# Patient Record
Sex: Male | Born: 1960 | Race: White | Hispanic: No | Marital: Married | Smoking: Never smoker
Health system: Southern US, Community
[De-identification: ages and names within clinical notes are randomized; demographics above are authoritative.]

## PROBLEM LIST (undated history)

## (undated) DIAGNOSIS — G4733 Obstructive sleep apnea (adult) (pediatric): Secondary | ICD-10-CM

## (undated) DIAGNOSIS — R338 Other retention of urine: Secondary | ICD-10-CM

## (undated) DIAGNOSIS — M1990 Unspecified osteoarthritis, unspecified site: Secondary | ICD-10-CM

## (undated) DIAGNOSIS — I10 Essential (primary) hypertension: Secondary | ICD-10-CM

## (undated) DIAGNOSIS — N529 Male erectile dysfunction, unspecified: Secondary | ICD-10-CM

## (undated) DIAGNOSIS — G2581 Restless legs syndrome: Secondary | ICD-10-CM

## (undated) DIAGNOSIS — N9989 Other postprocedural complications and disorders of genitourinary system: Secondary | ICD-10-CM

## (undated) HISTORY — DX: Other retention of urine: R33.8

## (undated) HISTORY — DX: Restless legs syndrome: G25.81

## (undated) HISTORY — DX: Other postprocedural complications and disorders of genitourinary system: N99.89

## (undated) HISTORY — DX: Essential (primary) hypertension: I10

## (undated) HISTORY — DX: Male erectile dysfunction, unspecified: N52.9

## (undated) HISTORY — PX: BREAST LUMPECTOMY: SHX2

## (undated) HISTORY — DX: Unspecified osteoarthritis, unspecified site: M19.90

## (undated) HISTORY — PX: VASECTOMY: SHX75

## (undated) HISTORY — DX: Obstructive sleep apnea (adult) (pediatric): G47.33

---

## 2013-10-02 HISTORY — PX: COLONOSCOPY: SHX174

## 2017-05-31 HISTORY — PX: COLONOSCOPY: SHX174

## 2018-03-07 ENCOUNTER — Other Ambulatory Visit: Payer: Self-pay | Admitting: Psychiatry

## 2018-03-07 ENCOUNTER — Ambulatory Visit
Admission: RE | Admit: 2018-03-07 | Discharge: 2018-03-07 | Disposition: A | Discharge status: Home / Self Care | Payer: BLUE CROSS/BLUE SHIELD | Source: Ambulatory Visit | Attending: Ophthalmology | Admitting: Ophthalmology

## 2018-03-07 ENCOUNTER — Other Ambulatory Visit: Payer: Self-pay | Admitting: Ophthalmology

## 2018-03-07 ENCOUNTER — Other Ambulatory Visit: Payer: Self-pay | Admitting: Family Medicine

## 2018-03-07 DIAGNOSIS — M79662 Pain in left lower leg: Secondary | ICD-10-CM | POA: Diagnosis present

## 2019-11-08 HISTORY — PX: TOTAL KNEE ARTHROPLASTY: SHX125

## 2020-02-27 HISTORY — PX: TOTAL KNEE ARTHROPLASTY: SHX125

## 2020-09-22 IMAGING — US US EXTREM LOW VENOUS*L*
1 series · 13 of 24 positions shown · non-contrast
Comparison: None.

CLINICAL DATA: Left lower leg pain for 1 week.  Recent travel.



[Series 1: us extrem low venous*left* · 13 of 34 slices shown]
[im 1/34]
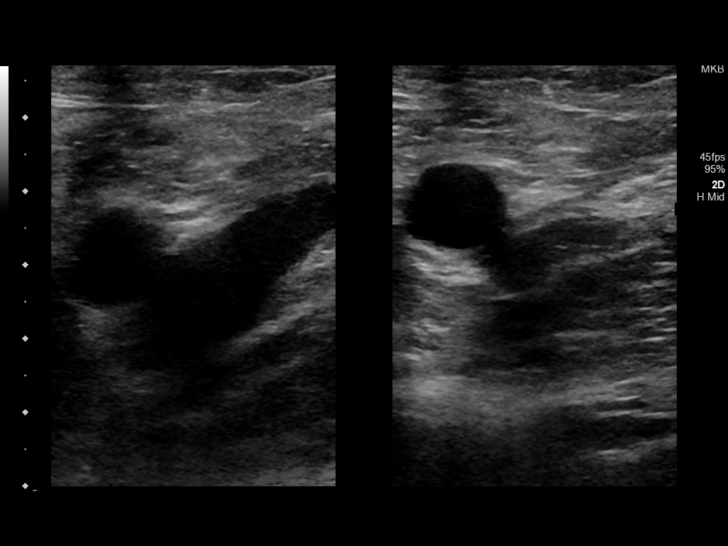
[im 3/34]
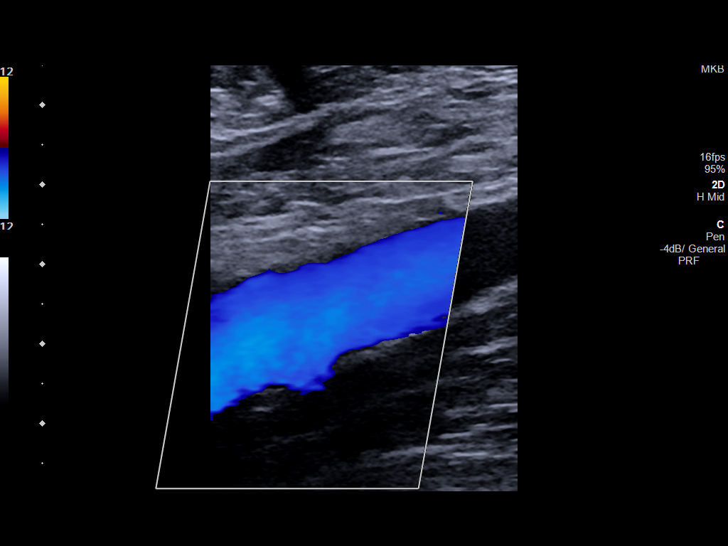
[im 6/34]
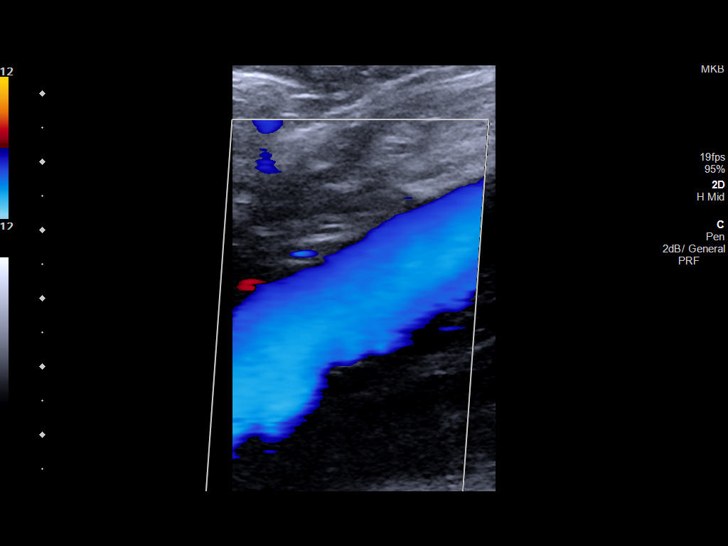
[im 9/34]
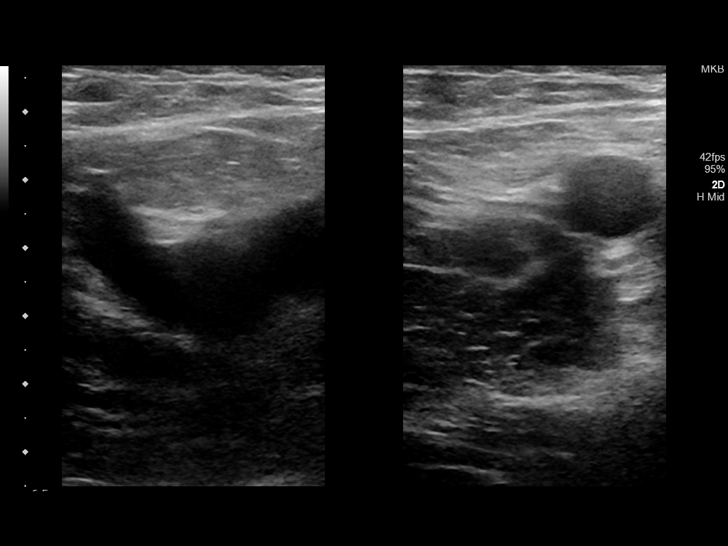
[im 12/34]
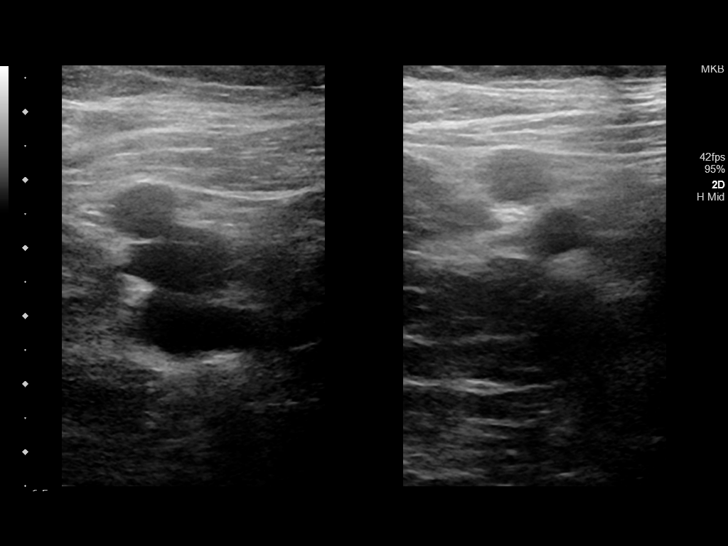
[im 15/34]
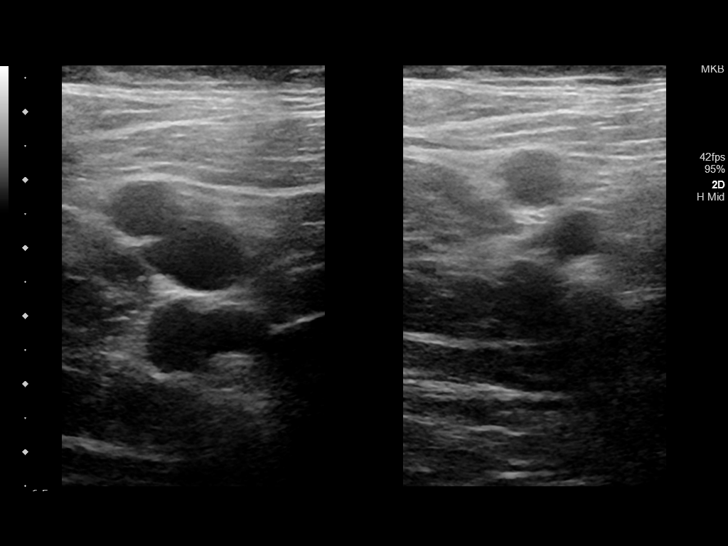
[im 18/34]
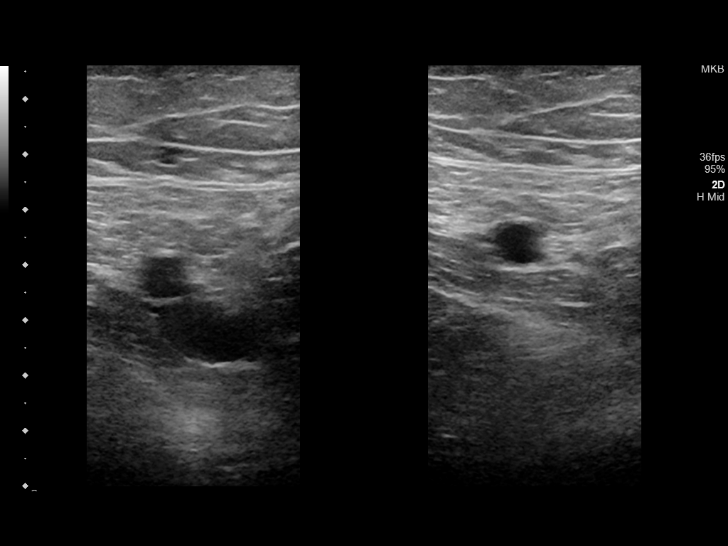
[im 19/34]
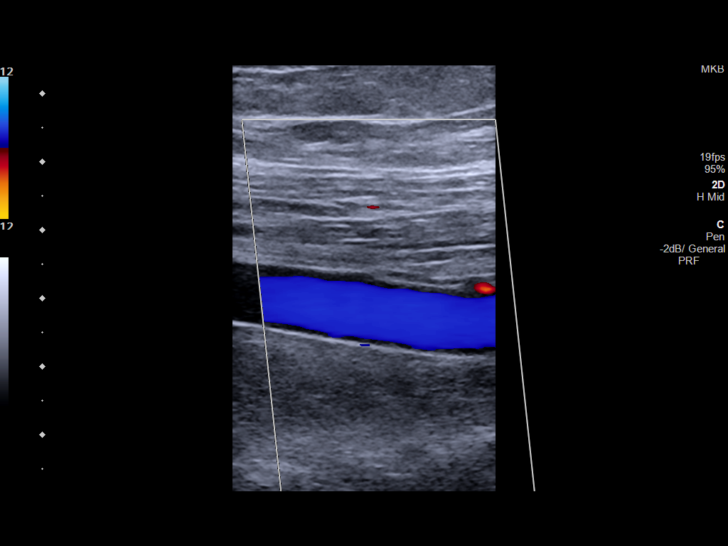
[im 22/34]
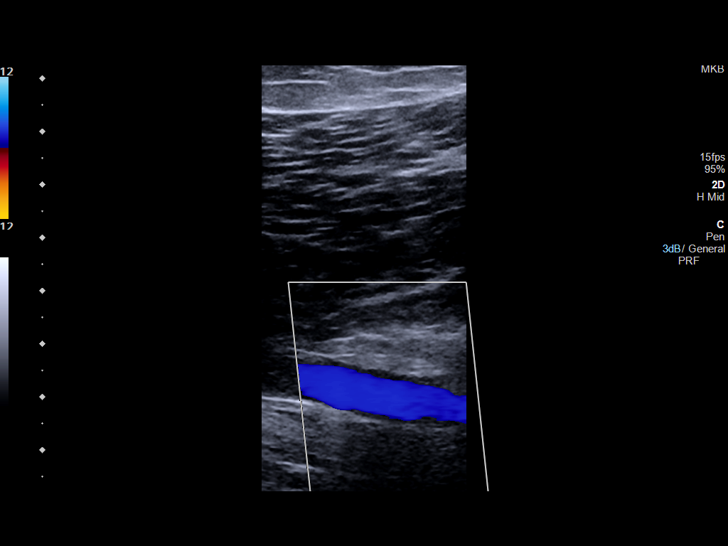
[im 25/34]
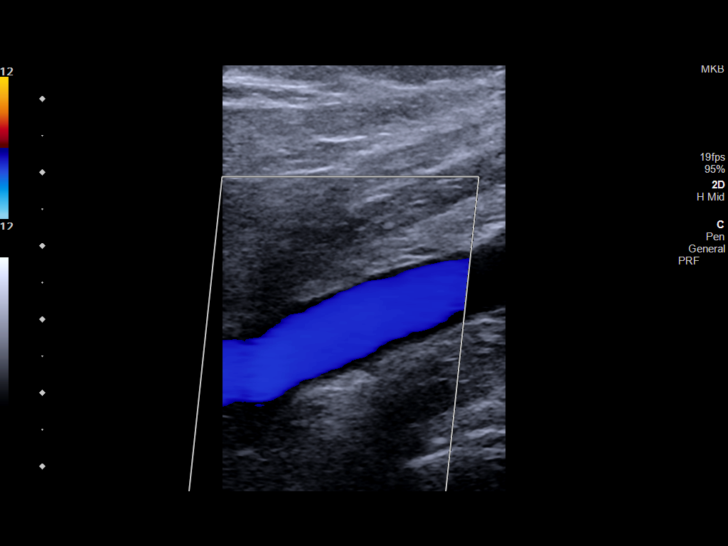
[im 28/34]
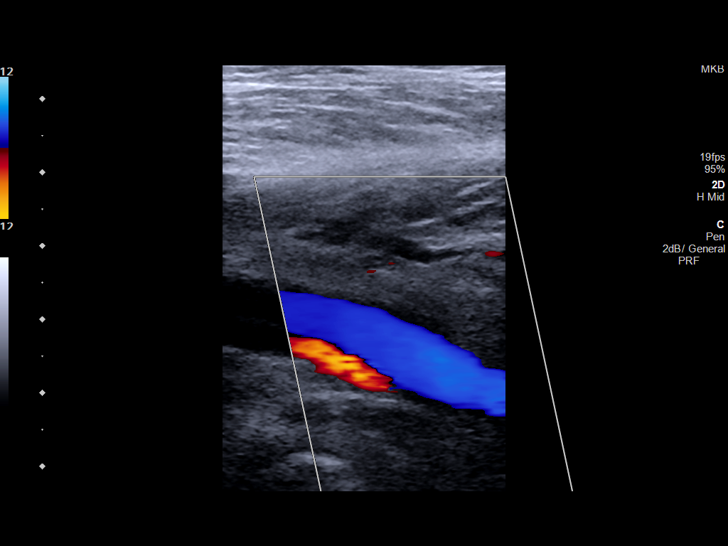
[im 31/34]
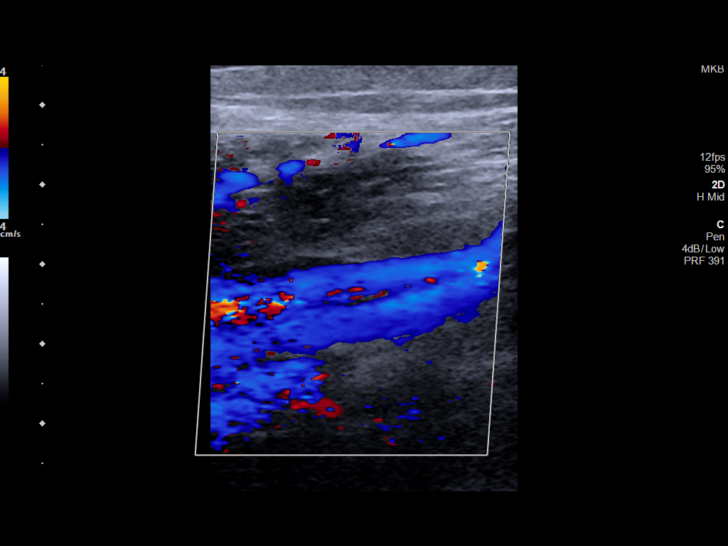
[im 34/34]
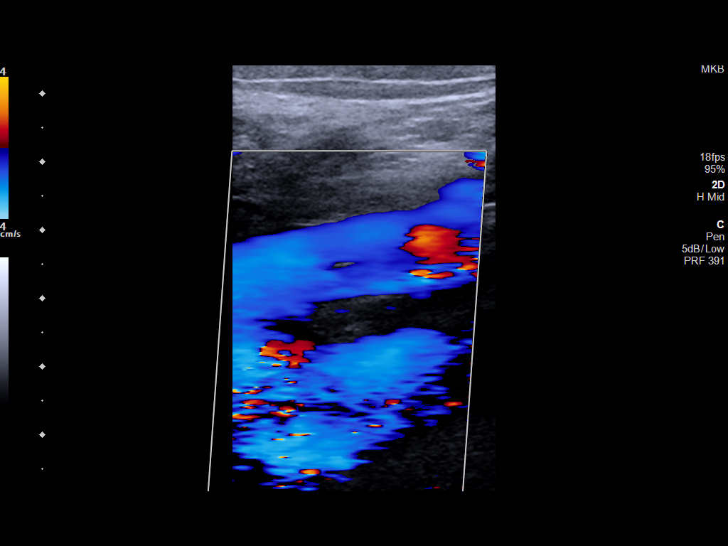

[13 of 24 positions shown; findings below may reference images not displayed]

FINDINGS: Contralateral Common Femoral Vein: Respiratory phasicity is normal
and symmetric with the symptomatic side. No evidence of thrombus.
Normal compressibility.

Common Femoral Vein: No evidence of thrombus. Normal
compressibility, respiratory phasicity and response to augmentation.

Saphenofemoral Junction: No evidence of thrombus. Normal
compressibility and flow on color Doppler imaging.

Profunda Femoral Vein: No evidence of thrombus. Normal
compressibility and flow on color Doppler imaging.

Femoral Vein: No evidence of thrombus. Normal compressibility,
respiratory phasicity and response to augmentation.

Popliteal Vein: No evidence of thrombus. Normal compressibility,
respiratory phasicity and response to augmentation.

Calf Veins: No evidence of thrombus. Normal compressibility and flow
on color Doppler imaging.

Other Findings:  None.
IMPRESSION: Negative for deep venous thrombosis in left lower extremity.

## 2021-12-24 ENCOUNTER — Encounter: Payer: Self-pay | Admitting: Urology

## 2021-12-24 ENCOUNTER — Ambulatory Visit (INDEPENDENT_AMBULATORY_CARE_PROVIDER_SITE_OTHER): Payer: BC Managed Care – PPO | Admitting: Urology

## 2021-12-24 ENCOUNTER — Other Ambulatory Visit
Admission: RE | Admit: 2021-12-24 | Discharge: 2021-12-24 | Disposition: A | Discharge status: Home / Self Care | Payer: BC Managed Care – PPO | Attending: Urology | Admitting: Urology

## 2021-12-24 VITALS — BP 132/88 | HR 67 | Ht 74.0 in | Wt 325.0 lb

## 2021-12-24 DIAGNOSIS — Z125 Encounter for screening for malignant neoplasm of prostate: Secondary | ICD-10-CM | POA: Diagnosis not present

## 2021-12-24 DIAGNOSIS — N529 Male erectile dysfunction, unspecified: Secondary | ICD-10-CM | POA: Diagnosis not present

## 2021-12-24 MED ORDER — TADALAFIL 10 MG PO TABS: 20.0000 mg | ORAL_TABLET | Freq: Every day | ORAL | 11 refills | Status: AC | PRN

## 2021-12-24 NOTE — Progress Notes (Signed)
   12/24/21 11:35 AM   Eric Davies 1960-09-20 [id number]  CC: ED, recent prostatitis, PSA screening.  HPI: I saw Eric Davies for the above issues.  He is a 61 year old male with morbid obesity and BMI of 42 who reports at least a few years of problems with erections.  Previously though this was responsive to tadalafil 10 to 20 mg on demand, but now even when taking 20 to 30 mg on demand he has difficulty maintaining an erection sufficient for sexual activity.  No prior testosterone values to review.  He also reports a recent episode of prostatitis that was treated with Cipro for 1 month, and completely resolved his symptoms of urgency/frequency, nocturia, and pelvic discomfort.  PSA screening has been normal, most recently 2.15 in December 2022   PMH: Past Medical History:  Diagnosis Date   ED (erectile dysfunction)    Hypertension    Obstructive sleep apnea    Osteoarthritis    Postoperative urinary retention    RLS (restless legs syndrome)     Family History: No family history on file.  Social History:  reports current alcohol use. He reports that he does not use drugs. No history on file for tobacco use.  Physical Exam: BP 132/88 (BP Location: Left Arm, Patient Position: Sitting, Cuff Size: Large)   Pulse 67   Ht 6\' 2"  (1.88 m)   Wt (!) 325 lb (147.4 kg)   BMI 41.73 kg/m    Constitutional:  Alert and oriented, No acute distress. Cardiovascular: No clubbing, cyanosis, or edema. Respiratory: Normal respiratory effort, no increased work of breathing. GI: Abdomen is soft, nontender, nondistended, no abdominal masses  Assessment & Plan:   61 year old male with ED despite 20 mg Cialis on demand, recent episode of prostatitis resolved with antibiotics, normal PSA.  We reviewed the AUA guidelines regarding ED evaluation and management, and I recommended checking a testosterone and estradiol in the setting of his morbid obesity.  Behavioral strategies discussed.  We discussed  other options including penile injections, vacuum erection device, penile rings as well.  We reviewed his normal PSA value and the AUA guidelines regarding screening.  Testosterone and estradiol level, call with results Continue Cialis 20 mg on demand, refilled to [email or web address]  41, MD 12/24/2021  [facility] [address] [place], [place] [phone]

## 2021-12-24 NOTE — Patient Instructions (Addendum)
[email or web address]  Erectile Dysfunction Erectile dysfunction (ED) is the inability to get or keep an erection in order to have sexual intercourse. ED is considered a symptom of an underlying disorder and is not considered a disease. ED may include: Inability to get an erection. Lack of enough hardness of the erection to allow penetration. Loss of erection before sex is finished. What are the causes? This condition may be caused by: Physical causes, such as: Artery problems. This may include heart disease, high blood pressure, atherosclerosis, and diabetes. Hormonal problems, such as low testosterone. Obesity. Nerve problems. This may include back or pelvic injuries, multiple sclerosis, Parkinson's disease, spinal cord injury, and stroke. Certain medicines, such as: Pain relievers. Antidepressants. Blood pressure medicines and water pills (diuretics). Cancer medicines. Antihistamines. Muscle relaxants. Lifestyle factors, such as: Use of drugs such as marijuana, cocaine, or opioids. Excessive use of alcohol. Smoking. Lack of physical activity or exercise. Psychological causes, such as: Anxiety or stress. Sadness or depression. Exhaustion. Fear about sexual performance. Guilt. What are the signs or symptoms? Symptoms of this condition include: Inability to get an erection. Lack of enough hardness of the erection to allow penetration. Loss of the erection before sex is finished. Sometimes having normal erections, but with frequent unsatisfactory episodes. Low sexual satisfaction in either partner due to erection problems. A curved penis occurring with erection. The curve may cause pain, or the penis may be too curved to allow for intercourse. Never having nighttime or morning erections. How is this diagnosed? This condition is often diagnosed by: Performing a physical exam to find other diseases or specific problems with the penis. Asking you  detailed questions about the problem. Doing tests, such as: Blood tests to check for diabetes mellitus or high cholesterol, or to measure hormone levels. Other tests to check for underlying health conditions. An ultrasound exam to check for scarring. A test to check blood flow to the penis. Doing a sleep study at home to measure nighttime erections. How is this treated? This condition may be treated by: Medicines, such as: Medicine taken by mouth to help you achieve an erection (oral medicine). Hormone replacement therapy to replace low testosterone levels. Medicine that is injected into the penis. Your health care provider may instruct you how to give yourself these injections at home. Medicine that is delivered with a short applicator tube. The tube is inserted into the opening at the tip of the penis, which is the opening of the urethra. A tiny pellet of medicine is put in the urethra. The pellet dissolves and enhances erectile function. This is also called MUSE (medicated urethral system for erections) therapy. Vacuum pump. This is a pump with a ring on it. The pump and ring are placed on the penis and used to create pressure that helps the penis become erect. Penile implant surgery. In this procedure, you may receive: An inflatable implant. This consists of cylinders, a pump, and a reservoir. The cylinders can be inflated with a fluid that helps to create an erection, and they can be deflated after intercourse. A semi-rigid implant. This consists of two silicone rubber rods. The rods provide some rigidity. They are also flexible, so the penis can both curve downward in its normal position and become straight for sexual intercourse. Blood vessel surgery to improve blood flow to the penis. During this procedure, a blood vessel from a different part of the body is placed into the penis to allow blood to flow around (bypass) damaged or blocked blood vessels.  Lifestyle changes, such as exercising  more, losing weight, and quitting smoking. Follow these instructions at home: Medicines  Take over-the-counter and prescription medicines only as told by your health care provider. Do not increase the dosage without first discussing it with your health care provider. If you are using self-injections, do injections as directed by your health care provider. Make sure you avoid any veins that are on the surface of the penis. After giving an injection, apply pressure to the injection site for 5 minutes. Talk to your health care provider about how to prevent headaches while taking ED medicines. These medicines may cause a sudden headache due to the increase in blood flow in your body. General instructions Exercise regularly, as directed by your health care provider. Work with your health care provider to lose weight, if needed. Do not use any products that contain nicotine or tobacco. These products include cigarettes, chewing tobacco, and vaping devices, such as e-cigarettes. If you need help quitting, ask your health care provider. Before using a vacuum pump, read the instructions that come with the pump and discuss any questions with your health care provider. Keep all follow-up visits. This is important. Contact a health care provider if: You feel nauseous. You are vomiting. You get sudden headaches while taking ED medicines. You have any concerns about your sexual health. Get help right away if: You are taking oral or injectable medicines and you have an erection that lasts longer than 4 hours. If your health care provider is unavailable, go to the nearest emergency room for evaluation. An erection that lasts much longer than 4 hours can result in permanent damage to your penis. You have severe pain in your groin or abdomen. You develop redness or severe swelling of your penis. You have redness spreading at your groin or lower abdomen. You are unable to urinate. You experience chest pain or a  rapid heartbeat (palpitations) after taking oral medicines. These symptoms may represent a serious problem that is an emergency. Do not wait to see if the symptoms will go away. Get medical help right away. Call your local emergency services (911 in the [place] Do not drive yourself to the hospital. Summary Erectile dysfunction (ED) is the inability to get or keep an erection during sexual intercourse. This condition is diagnosed based on a physical exam, your symptoms, and tests to determine the cause. Treatment varies depending on the cause and may include medicines, hormone therapy, surgery, or a vacuum pump. You may need follow-up visits to make sure that you are using your medicines or devices correctly. Get help right away if you are taking or injecting medicines and you have an erection that lasts longer than 4 hours. This information is not intended to replace advice given to you by your health care provider. Make sure you discuss any questions you have with your health care provider. Document Revised: 08/28/2020 Document Reviewed: 08/28/2020 Elsevier Patient Education  2023 [employer].

## 2021-12-25 LAB — TESTOSTERONE: Testosterone: 492 ng/dL (ref 264–916)

## 2021-12-31 ENCOUNTER — Telehealth: Payer: Self-pay | Admitting: Family Medicine

## 2021-12-31 DIAGNOSIS — N529 Male erectile dysfunction, unspecified: Secondary | ICD-10-CM

## 2021-12-31 NOTE — Telephone Encounter (Signed)
Aram Beecham from [facility] lab called and states she drew the Estradiol and Testosterone but the Estradiol got cancelled because she messed it up. She states it was her fault. They have already dumped the blood so she couldn't get it corrected.

## 2022-01-01 NOTE — Telephone Encounter (Signed)
Called pt informed him of the information below. Pt voiced understanding. Lab ordered.  

## 2022-01-05 ENCOUNTER — Other Ambulatory Visit
Admission: RE | Admit: 2022-01-05 | Discharge: 2022-01-05 | Disposition: A | Discharge status: Home / Self Care | Payer: BC Managed Care – PPO | Attending: Urology | Admitting: Urology

## 2022-01-05 DIAGNOSIS — N529 Male erectile dysfunction, unspecified: Secondary | ICD-10-CM | POA: Insufficient documentation

## 2022-01-06 LAB — ESTRADIOL: Estradiol: 26.3 pg/mL (ref 7.6–42.6)

## 2022-05-20 ENCOUNTER — Ambulatory Visit
Admission: RE | Admit: 2022-05-20 | Discharge: 2022-05-20 | Disposition: A | Discharge status: Home / Self Care | Payer: BC Managed Care – PPO | Attending: Internal Medicine | Admitting: Internal Medicine

## 2022-05-20 ENCOUNTER — Encounter
Admission: RE | Disposition: A | Discharge status: Home / Self Care | Payer: Self-pay | Source: Home / Self Care | Attending: Internal Medicine

## 2022-05-20 ENCOUNTER — Ambulatory Visit: Payer: BC Managed Care – PPO | Admitting: Anesthesiology

## 2022-05-20 ENCOUNTER — Other Ambulatory Visit: Payer: Self-pay

## 2022-05-20 ENCOUNTER — Encounter: Payer: Self-pay | Admitting: Internal Medicine

## 2022-05-20 DIAGNOSIS — Z86010 Personal history of colonic polyps: Secondary | ICD-10-CM | POA: Insufficient documentation

## 2022-05-20 DIAGNOSIS — K5730 Diverticulosis of large intestine without perforation or abscess without bleeding: Secondary | ICD-10-CM | POA: Insufficient documentation

## 2022-05-20 DIAGNOSIS — K640 First degree hemorrhoids: Secondary | ICD-10-CM | POA: Diagnosis not present

## 2022-05-20 DIAGNOSIS — Z79899 Other long term (current) drug therapy: Secondary | ICD-10-CM | POA: Diagnosis not present

## 2022-05-20 DIAGNOSIS — M1990 Unspecified osteoarthritis, unspecified site: Secondary | ICD-10-CM | POA: Diagnosis not present

## 2022-05-20 DIAGNOSIS — I10 Essential (primary) hypertension: Secondary | ICD-10-CM | POA: Diagnosis not present

## 2022-05-20 DIAGNOSIS — Z1211 Encounter for screening for malignant neoplasm of colon: Secondary | ICD-10-CM | POA: Diagnosis present

## 2022-05-20 DIAGNOSIS — G2581 Restless legs syndrome: Secondary | ICD-10-CM | POA: Diagnosis not present

## 2022-05-20 DIAGNOSIS — G4733 Obstructive sleep apnea (adult) (pediatric): Secondary | ICD-10-CM | POA: Diagnosis not present

## 2022-05-20 HISTORY — PX: COLONOSCOPY: SHX5424

## 2022-05-20 SURGERY — COLONOSCOPY
Anesthesia: General

## 2022-05-20 MED ORDER — MIDAZOLAM HCL 2 MG/2ML IJ SOLN
INTRAMUSCULAR | Status: AC
  Filled 2022-05-20: qty 2

## 2022-05-20 MED ORDER — PROPOFOL 500 MG/50ML IV EMUL
INTRAVENOUS | Status: DC | PRN
  Administered 2022-05-20: 150 ug/kg/min via INTRAVENOUS

## 2022-05-20 MED ORDER — MIDAZOLAM HCL 2 MG/2ML IJ SOLN
INTRAMUSCULAR | Status: DC | PRN
  Administered 2022-05-20: 2 mg via INTRAVENOUS

## 2022-05-20 MED ORDER — DEXMEDETOMIDINE HCL IN NACL 200 MCG/50ML IV SOLN
INTRAVENOUS | Status: DC | PRN
  Administered 2022-05-20: 8 ug via INTRAVENOUS

## 2022-05-20 MED ORDER — LIDOCAINE HCL (CARDIAC) PF 100 MG/5ML IV SOSY
PREFILLED_SYRINGE | INTRAVENOUS | Status: DC | PRN
  Administered 2022-05-20: 50 mg via INTRAVENOUS

## 2022-05-20 MED ORDER — PROPOFOL 1000 MG/100ML IV EMUL
INTRAVENOUS | Status: AC
  Filled 2022-05-20: qty 100

## 2022-05-20 MED ORDER — PROPOFOL 10 MG/ML IV BOLUS
INTRAVENOUS | Status: DC | PRN
  Administered 2022-05-20: 50 mg via INTRAVENOUS

## 2022-05-20 MED ORDER — SODIUM CHLORIDE 0.9 % IV SOLN: INTRAVENOUS | Status: DC

## 2022-05-20 MED ORDER — PHENYLEPHRINE HCL (PRESSORS) 10 MG/ML IV SOLN
INTRAVENOUS | Status: DC | PRN
  Administered 2022-05-20: 160 ug via INTRAVENOUS

## 2022-05-20 NOTE — Transfer of Care (Signed)
Immediate Anesthesia Transfer of Care Note  Patient: Eric Davies  Procedure(s) Performed: COLONOSCOPY  Patient Location: PACU  Anesthesia Type:General  Level of Consciousness: sedated  Airway & Oxygen Therapy: Patient Spontanous Breathing and Patient connected to nasal cannula oxygen  Post-op Assessment: Report given to RN and Post -op Vital signs reviewed and stable  Post vital signs: Reviewed and stable  Last Vitals:  Vitals Value Taken Time  BP 93/62 05/20/22 1350  Temp 36.3 C 05/20/22 1350  Pulse 72 05/20/22 1350  Resp 15 05/20/22 1350  SpO2 92 % 05/20/22 1350  Vitals shown include unvalidated device data.  Last Pain:  Vitals:   05/20/22 1350  TempSrc: Temporal  PainSc:          Complications: No notable events documented.

## 2022-05-20 NOTE — H&P (Signed)
  Outpatient short stay form Pre-procedure 05/20/2022 1:27 PM Jamilah Jean K. Norma Fredrickson, M.D.  Primary Physician: Maudie Flakes, M.D.  Reason for visit:  Personal history of adenomatous colon polyps  History of present illness:  Mr. Sprigg presents to the [facility] clinic for chief complaint of high-risk colon cancer surveillance 2/2 personal hx of adenomatous polyp of colon. Patient had original screening colonoscopy performed 09/2013 which revealed one subcentimeter tubular adenoma removed at 18 cm. Repeat surveillance colonoscopy 05/2017 performed by Dr. Norma Fredrickson showed sigmoid diverticulosis and otherwise normal examined colon. He denies any known family history of colorectal adenocarcinoma, advanced adenomas, or IBD. He denies any acute GI complaints today. He denies any recent changes in his bowel habits. Bowels are regular and typically 1-2 formed bowel movements daily. He denies any issues with hematochezia, melena, fecal urgency, or fecal incontinence. No issues with abdominal pain or abdominal cramping. Appetite and diet are stable without any unintentional weight loss. He denies any issues with esophageal dysphagia, odynophagia, early satiety, hoarseness, or epigastric abdominal pain. He enjoys riding his motorcycle in his free time and reading. No significant cardiac or pulmonary comorbidities.     Current Facility-Administered Medications:    0.9 %  sodium chloride infusion, , Intravenous, Continuous, [place], Boykin Nearing, MD, Last Rate: 20 mL/hr at 05/20/22 1220, New Bag at 05/20/22 1220  Medications Prior to Admission  Medication Sig Dispense Refill Last Dose   aspirin EC 81 MG tablet Take 81 mg by mouth daily. Swallow whole.   05/19/2022   gabapentin (NEURONTIN) 100 MG capsule Take 100 mg by mouth at bedtime.   05/19/2022   Glucosamine HCl-MSM (GLUCOSAMINE-MSM PO) Take 2 tablets by mouth daily.   05/19/2022   lisinopril-hydrochlorothiazide (ZESTORETIC) 20-25 MG tablet Take 1 tablet by mouth  daily.   05/20/2022 at 0600   Misc Natural Products (NEURIVA PO) Take by mouth daily.   05/19/2022   Multiple Vitamins-Minerals (CENTRUM SILVER 50+MEN PO) Take 1 tablet by mouth daily.   05/19/2022   rOPINIRole (REQUIP) 0.25 MG tablet Take by mouth.   05/19/2022   tadalafil (CIALIS) 10 MG tablet Take 2 tablets (20 mg total) by mouth daily as needed for erectile dysfunction. 60 tablet 11 05/19/2022   UNABLE TO FIND 2 (two) times daily. Super Beta Prostate   05/19/2022     No Known Allergies   Past Medical History:  Diagnosis Date   ED (erectile dysfunction)    Hypertension    Obstructive sleep apnea    Osteoarthritis    Postoperative urinary retention    RLS (restless legs syndrome)     Review of systems:  Otherwise negative.    Physical Exam  Gen: Alert, oriented. Appears stated age.  HEENT: [place]/AT. PERRLA. Lungs: CTA, no wheezes. CV: RR nl S1, S2. Abd: soft, benign, no masses. BS+ Ext: No edema. Pulses 2+    Planned procedures: Proceed with colonoscopy. The patient understands the nature of the planned procedure, indications, risks, alternatives and potential complications including but not limited to bleeding, infection, perforation, damage to internal organs and possible oversedation/side effects from anesthesia. The patient agrees and gives consent to proceed.  Please refer to procedure notes for findings, recommendations and patient disposition/instructions.     Naleigha Raimondi K. Norma Fredrickson, M.D. Gastroenterology 05/20/2022  1:27 PM

## 2022-05-20 NOTE — Op Note (Signed)
[facility] Patient Name: Eric Davies Procedure Date: 05/20/2022 1:25 PM MRN: [id number] Account #: [id number] Date of Birth: 10-21-1960 Admit Type: Outpatient Age: 61 Room: [facility] ENDO ROOM 2 Gender: Male Note Status: Finalized Instrument Name: Nelda Marseille [id number] Procedure:             Colonoscopy Indications:           High risk colon cancer surveillance: Personal history                         of non-advanced adenoma Providers:             Boykin Nearing. Norma Fredrickson MD, MD Referring MD:          Marina Goodell (Referring MD) Medicines:             Propofol per Anesthesia Complications:         No immediate complications. Procedure:             Pre-Anesthesia Assessment:                        - The risks and benefits of the procedure and the                         sedation options and risks were discussed with the                         patient. All questions were answered and informed                         consent was obtained.                        - Patient identification and proposed procedure were                         verified prior to the procedure by the nurse. The                         procedure was verified in the procedure room.                        - ASA Grade Assessment: III - A patient with severe                         systemic disease.                        - After reviewing the risks and benefits, the patient                         was deemed in satisfactory condition to undergo the                         procedure.                        After obtaining informed consent, the colonoscope was                         passed under direct  vision. Throughout the procedure,                         the patient's blood pressure, pulse, and oxygen                         saturations were monitored continuously. The                         Colonoscope was introduced through the anus and                         advanced to the  the cecum, identified by appendiceal                         orifice and ileocecal valve. The colonoscopy was                         performed without difficulty. The patient tolerated                         the procedure well. The quality of the bowel                         preparation was adequate. The ileocecal valve,                         appendiceal orifice, and rectum were photographed. Findings:      The perianal and digital rectal examinations were normal. Pertinent       negatives include normal sphincter tone and no palpable rectal lesions.      Non-bleeding internal hemorrhoids were found during retroflexion. The       hemorrhoids were Grade I (internal hemorrhoids that do not prolapse).      The colon (entire examined portion) appeared normal. Impression:            - Non-bleeding internal hemorrhoids.                        - The entire examined colon is normal.                        - No specimens collected. Recommendation:        - Patient has a contact number available for                         emergencies. The signs and symptoms of potential                         delayed complications were discussed with the patient.                         Return to normal activities tomorrow. Written                         discharge instructions were provided to the patient.                        - Resume previous diet.                        -  Continue present medications.                        - Repeat colonoscopy in 10 years for screening                         purposes.                        - Return to GI office PRN.                        - The findings and recommendations were discussed with                         the patient. Procedure Code(s):     --- Professional ---                        P7948, Colorectal cancer screening; colonoscopy on                         individual at high risk Diagnosis Code(s):     --- Professional ---                         K64.0, First degree hemorrhoids                        Z86.010, Personal history of colonic polyps CPT copyright 2022 American Medical Association. All rights reserved. The codes documented in this report are preliminary and upon coder review may  be revised to meet current compliance requirements. Stanton Kidney MD, MD 05/20/2022 1:48:18 PM This report has been signed electronically. Number of Addenda: 0 Note Initiated On: 05/20/2022 1:25 PM Scope Withdrawal Time: 0 hours 8 minutes 5 seconds  Total Procedure Duration: 0 hours 11 minutes 5 seconds  Estimated Blood Loss:  Estimated blood loss: none.      [facility]

## 2022-05-20 NOTE — Anesthesia Postprocedure Evaluation (Signed)
Anesthesia Post Note  Patient: Eric Davies  Procedure(s) Performed: COLONOSCOPY  Patient location during evaluation: Endoscopy Anesthesia Type: General Level of consciousness: awake and alert Pain management: pain level controlled Vital Signs Assessment: post-procedure vital signs reviewed and stable Respiratory status: spontaneous breathing, nonlabored ventilation, respiratory function stable and patient connected to nasal cannula oxygen Cardiovascular status: blood pressure returned to baseline and stable Postop Assessment: no apparent nausea or vomiting Anesthetic complications: no   No notable events documented.   Last Vitals:  Vitals:   05/20/22 1209 05/20/22 1350  BP: 128/81 93/62  Pulse: 70 71  Resp: 16 14  Temp: (!) 36.4 C (!) 36.3 C  SpO2: 95% 93%    Last Pain:  Vitals:   05/20/22 1350  TempSrc: Temporal  PainSc: Asleep                 Louie Boston

## 2022-05-20 NOTE — Anesthesia Preprocedure Evaluation (Signed)
Anesthesia Evaluation  Patient identified by MRN, date of birth, ID band Patient awake    Reviewed: Allergy & Precautions, NPO status , Patient's Chart, lab work & pertinent test results  History of Anesthesia Complications Negative for: history of anesthetic complications  Airway Mallampati: I  TM Distance: >3 FB Neck ROM: full    Dental  (+) Teeth Intact   Pulmonary sleep apnea and Continuous Positive Airway Pressure Ventilation    Pulmonary exam normal        Cardiovascular hypertension, On Medications Normal cardiovascular exam     Neuro/Psych negative neurological ROS  negative psych ROS   GI/Hepatic negative GI ROS, Neg liver ROS,,,  Endo/Other    Morbid obesity  Renal/GU negative Renal ROS  negative genitourinary   Musculoskeletal  (+) Arthritis ,    Abdominal   Peds  Hematology negative hematology ROS (+)   Anesthesia Other Findings Past Medical History: No date: ED (erectile dysfunction) No date: Hypertension No date: Obstructive sleep apnea No date: Osteoarthritis No date: Postoperative urinary retention No date: RLS (restless legs syndrome)  Past Surgical History: No date: BREAST LUMPECTOMY; Right 05/31/2017: COLONOSCOPY 10/02/2013: COLONOSCOPY 11/08/2019: TOTAL KNEE ARTHROPLASTY; Right 02/27/2020: TOTAL KNEE ARTHROPLASTY; Left No date: VASECTOMY  BMI 43.67    Reproductive/Obstetrics negative OB ROS                              Anesthesia Physical Anesthesia Plan  ASA: 3  Anesthesia Plan: General   Post-op Pain Management: Minimal or no pain anticipated   Induction: Intravenous  PONV Risk Score and Plan: Propofol infusion and TIVA  Airway Management Planned: Natural Airway and Nasal Cannula  Additional Equipment:   Intra-op Plan:   Post-operative Plan:   Informed Consent: I have reviewed the patients History and Physical, chart, labs and  discussed the procedure including the risks, benefits and alternatives for the proposed anesthesia with the patient or authorized representative who has indicated his/her understanding and acceptance.     Dental Advisory Given  Plan Discussed with: Anesthesiologist, CRNA and Surgeon  Anesthesia Plan Comments: (Patient consented for risks of anesthesia including but not limited to:  - adverse reactions to medications - risk of airway placement if required - damage to eyes, teeth, lips or other oral mucosa - nerve damage due to positioning  - sore throat or hoarseness - Damage to heart, brain, nerves, lungs, other parts of body or loss of life  Patient voiced understanding.)         Anesthesia Quick Evaluation

## 2022-05-20 NOTE — Interval H&P Note (Signed)
History and Physical Interval Note:  05/20/2022 1:27 PM  Eric Davies  has presented today for surgery, with the diagnosis of Hx of adenomatous polyp of colon (Z86.010).  The various methods of treatment have been discussed with the patient and family. After consideration of risks, benefits and other options for treatment, the patient has consented to  Procedure(s): COLONOSCOPY (N/A) as a surgical intervention.  The patient's history has been reviewed, patient examined, no change in status, stable for surgery.  I have reviewed the patient's chart and labs.  Questions were answered to the patient's satisfaction.     [place], [place]

## 2022-05-21 ENCOUNTER — Encounter: Payer: Self-pay | Admitting: Internal Medicine
